# Patient Record
Sex: Male | Born: 1995 | Race: White | Hispanic: No | Marital: Single | Smoking: Never smoker
Health system: Southern US, Community
[De-identification: ages and names within clinical notes are randomized; demographics above are authoritative.]

---
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
## 2017-02-18 ENCOUNTER — Emergency Department: Payer: No Typology Code available for payment source

## 2017-02-18 ENCOUNTER — Emergency Department
Admission: EM | Admit: 2017-02-18 | Discharge: 2017-02-18 | Disposition: A | Discharge status: Home / Self Care | Payer: No Typology Code available for payment source | Attending: Emergency Medicine | Admitting: Emergency Medicine

## 2017-02-18 DIAGNOSIS — M549 Dorsalgia, unspecified: Secondary | ICD-10-CM

## 2017-02-18 DIAGNOSIS — Y999 Unspecified external cause status: Secondary | ICD-10-CM | POA: Diagnosis not present

## 2017-02-18 DIAGNOSIS — S161XXA Strain of muscle, fascia and tendon at neck level, initial encounter: Secondary | ICD-10-CM | POA: Diagnosis not present

## 2017-02-18 DIAGNOSIS — Y9389 Activity, other specified: Secondary | ICD-10-CM | POA: Diagnosis not present

## 2017-02-18 DIAGNOSIS — M546 Pain in thoracic spine: Secondary | ICD-10-CM | POA: Diagnosis not present

## 2017-02-18 DIAGNOSIS — S0990XA Unspecified injury of head, initial encounter: Secondary | ICD-10-CM | POA: Diagnosis present

## 2017-02-18 DIAGNOSIS — Y92410 Unspecified street and highway as the place of occurrence of the external cause: Secondary | ICD-10-CM | POA: Insufficient documentation

## 2017-02-18 MED ORDER — CYCLOBENZAPRINE HCL 10 MG PO TABS
10.0000 mg | ORAL_TABLET | Freq: Once | ORAL | Status: AC
  Administered 2017-02-18: 10 mg via ORAL
  Filled 2017-02-18: qty 1

## 2017-02-18 MED ORDER — CYCLOBENZAPRINE HCL 10 MG PO TABS: 10.0000 mg | ORAL_TABLET | Freq: Three times a day (TID) | ORAL | 0 refills | Status: AC | PRN

## 2017-02-18 MED ORDER — NAPROXEN 500 MG PO TABS: 500.0000 mg | ORAL_TABLET | Freq: Two times a day (BID) | ORAL | 0 refills | Status: AC

## 2017-02-18 MED ORDER — NAPROXEN 500 MG PO TABS
500.0000 mg | ORAL_TABLET | Freq: Once | ORAL | Status: AC
  Administered 2017-02-18: 500 mg via ORAL
  Filled 2017-02-18: qty 1
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
## 2017-02-18 NOTE — Discharge Instructions (Signed)
CT scans and x-rays all negative today. Take the medications as prescribed. See your primary care provider for symptoms that are not improving over the week. Return to the ER for symptoms that change or worsen if you are unable to schedule an appointment.
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
## 2017-06-28 ENCOUNTER — Emergency Department
Admission: EM | Admit: 2017-06-28 | Discharge: 2017-06-28 | Disposition: A | Discharge status: Home / Self Care | Payer: Medicaid Other | Attending: Emergency Medicine | Admitting: Emergency Medicine

## 2017-06-28 ENCOUNTER — Encounter: Payer: Self-pay | Admitting: Emergency Medicine

## 2017-06-28 ENCOUNTER — Emergency Department: Payer: Medicaid Other

## 2017-06-28 DIAGNOSIS — Z79899 Other long term (current) drug therapy: Secondary | ICD-10-CM | POA: Diagnosis not present

## 2017-06-28 DIAGNOSIS — R42 Dizziness and giddiness: Secondary | ICD-10-CM | POA: Diagnosis present

## 2017-06-28 LAB — URINE DRUG SCREEN, QUALITATIVE (ARMC ONLY)
AMPHETAMINES, UR SCREEN: NOT DETECTED
BARBITURATES, UR SCREEN: NOT DETECTED
Benzodiazepine, Ur Scrn: NOT DETECTED
COCAINE METABOLITE, UR ~~LOC~~: NOT DETECTED
Cannabinoid 50 Ng, Ur ~~LOC~~: NOT DETECTED
MDMA (ECSTASY) UR SCREEN: NOT DETECTED
METHADONE SCREEN, URINE: NOT DETECTED
Opiate, Ur Screen: NOT DETECTED
Phencyclidine (PCP) Ur S: NOT DETECTED
TRICYCLIC, UR SCREEN: NOT DETECTED

## 2017-06-28 LAB — URINALYSIS, COMPLETE (UACMP) WITH MICROSCOPIC
Bacteria, UA: NONE SEEN
Bilirubin Urine: NEGATIVE
GLUCOSE, UA: NEGATIVE mg/dL
HGB URINE DIPSTICK: NEGATIVE
Ketones, ur: NEGATIVE mg/dL
Leukocytes, UA: NEGATIVE
NITRITE: NEGATIVE
Protein, ur: NEGATIVE mg/dL
SPECIFIC GRAVITY, URINE: 1.002 — AB (ref 1.005–1.030)
Squamous Epithelial / LPF: NONE SEEN
pH: 7 (ref 5.0–8.0)

## 2017-06-28 LAB — BASIC METABOLIC PANEL
Anion gap: 8 (ref 5–15)
BUN: 10 mg/dL (ref 6–20)
CALCIUM: 9.7 mg/dL (ref 8.9–10.3)
CO2: 27 mmol/L (ref 22–32)
CREATININE: 1.23 mg/dL (ref 0.61–1.24)
Chloride: 104 mmol/L (ref 101–111)
GLUCOSE: 103 mg/dL — AB (ref 65–99)
Potassium: 4.3 mmol/L (ref 3.5–5.1)
Sodium: 139 mmol/L (ref 135–145)

## 2017-06-28 LAB — CBC
HCT: 45.2 % (ref 40.0–52.0)
Hemoglobin: 15.5 g/dL (ref 13.0–18.0)
MCH: 30.8 pg (ref 26.0–34.0)
MCHC: 34.3 g/dL (ref 32.0–36.0)
MCV: 89.9 fL (ref 80.0–100.0)
PLATELETS: 181 10*3/uL (ref 150–440)
RBC: 5.02 MIL/uL (ref 4.40–5.90)
RDW: 12.5 % (ref 11.5–14.5)
WBC: 8.4 10*3/uL (ref 3.8–10.6)

## 2017-06-28 LAB — TSH: TSH: 1.773 u[IU]/mL (ref 0.350–4.500)

## 2017-06-28 LAB — T4, FREE: Free T4: 0.9 ng/dL (ref 0.61–1.12)

## 2017-06-28 LAB — TROPONIN I: Troponin I: <0.03 ng/mL (ref <0.03)

## 2017-06-28 MED ORDER — SODIUM CHLORIDE 0.9 % IV BOLUS (SEPSIS)
1000.0000 mL | Freq: Once | INTRAVENOUS | Status: AC
  Administered 2017-06-28: 1000 mL via INTRAVENOUS

## 2017-06-28 NOTE — ED Triage Notes (Signed)
Pt states he started having weakness last night that lasted several hours and vomited once.  Pts mom stated he should go get checked out today if he is not feeling better.  He states he felt dizzy, unaware, photosensitivity, and at times off balance.  Pt is AOx4 during triage.

## 2017-06-28 NOTE — ED Provider Notes (Signed)
[facility] Emergency Department Provider Note  ____________________________________________  Time seen: Approximately 9:15 PM  I have reviewed the triage vital signs and the nursing notes.   HISTORY  Chief Complaint Dizziness   HPI Ronald Peyeraul T Iribe Jr. is a 21 y.o. male with no significant past medical history who presents for evaluation of lightheadedness. Patient reports that after leaving work yesterday he started feeling lightheaded. 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 Had 2 episodes of nonbloody nonbilious emesis and went home. He reports that he didn't drink much fluid throughout the day yesterday and was really hot. This morning he woke up feeling better however continued to have intermittent episodes of lightheadedness. Has been nauseated and not had a normal appetite today. Not really eating or drinking much today. No diarrhea, no cough or congestion, no fever or chills, no chest pain or shortness of breath, no abdominal pain, no dysuria or hematuria, no palpitations, no syncope. No tick bites. No headache. No neck stiffness. Denies alcohol, drugs or cigarettes.  History reviewed. No pertinent past medical history.  There are no active problems to display for this patient.   History reviewed. No pertinent surgical history.  Prior to Admission medications   Medication Sig Start Date End Date Taking? Authorizing Provider  cyclobenzaprine (FLEXERIL) 10 MG tablet Take 1 tablet (10 mg total) by mouth 3 (three) times daily as needed for muscle spasms. 02/18/17   Triplett, Rulon Eisenmengerari B, FNP  naproxen (NAPROSYN) 500 MG tablet Take 1 tablet (500 mg total) by mouth 2 (two) times daily with a meal. 02/18/17   Triplett, Cari B, FNP    Allergies Penicillins  No family history on file.  Social History Social History  Substance Use Topics  . Smoking status: Never Smoker  . Smokeless tobacco: Never Used  . Alcohol use No    Review of Systems  Constitutional: Negative  for fever. + Lightheadedness Eyes: Negative for visual changes. ENT: Negative for sore throat. Neck: No neck pain  Cardiovascular: Negative for chest pain. Respiratory: Negative for shortness of breath. Gastrointestinal: Negative for abdominal pain,  Diarrhea. + N/V Genitourinary: Negative for dysuria. Musculoskeletal: Negative for back pain. Skin: Negative for rash. Neurological: Negative for headaches, weakness or numbness. Psych: No SI or HI  ____________________________________________   PHYSICAL EXAM:  VITAL SIGNS: ED Triage Vitals [06/28/17 2013]  Enc Vitals Group     BP (!) 135/53     Pulse Rate (!) 54     Resp 16     Temp 98.6 F (37 C)     Temp Source Oral     SpO2 100 %     Weight 136 lb (61.7 kg)     Height 5\' 9"  (1.753 m)     Head Circumference      Peak Flow      Pain Score 0     Pain Loc      Pain Edu?      Excl. in GC?     Constitutional: Alert and oriented. Well appearing and in no apparent distress. HEENT:      Head: Normocephalic and atraumatic.         Eyes: Conjunctivae are normal. Sclera is non-icteric.       Mouth/Throat: Mucous membranes are moist.       Neck: Supple with no signs of meningismus. Cardiovascular: Bradycardic with regular rhythm. No murmurs, gallops, or rubs. 2+ symmetrical distal pulses are present in all extremities. No JVD. Respiratory: Normal respiratory effort. Lungs  are clear to auscultation bilaterally. No wheezes, crackles, or rhonchi.  Gastrointestinal: Soft, non tender, and non distended with positive bowel sounds. No rebound or guarding. Genitourinary: No CVA tenderness. Musculoskeletal: Nontender with normal range of motion in all extremities. No edema, cyanosis, or erythema of extremities. Neurologic: Normal speech and language. Face is symmetric. Moving all extremities. No gross focal neurologic deficits are appreciated. Skin: Skin is warm, dry and intact. No rash noted. Psychiatric: Mood and affect are normal.  Speech and behavior are normal.  ____________________________________________   LABS (all labs ordered are listed, but only abnormal results are displayed)  Labs Reviewed  BASIC METABOLIC PANEL - Abnormal; Notable for the following:       Result Value   Glucose, Bld 103 (*)    All other components within normal limits  URINALYSIS, COMPLETE (UACMP) WITH MICROSCOPIC - Abnormal; Notable for the following:    Color, Urine STRAW (*)    APPearance CLEAR (*)    Specific Gravity, Urine 1.002 (*)    All other components within normal limits  CBC  TSH  TROPONIN I  T4, FREE  URINE DRUG SCREEN, QUALITATIVE ([facility] ONLY)  CBG MONITORING, ED   ____________________________________________  EKG  ED ECG REPORT I, Nita Sickle, the attending physician, personally viewed and interpreted this ECG.  Sinus bradycardia, rate of 53, right bundle branch block, normal QTC, normal axis, no ST elevations or depressions. No prior for comparison.  ____________________________________________  RADIOLOGY  CXR: Negative ____________________________________________   PROCEDURES  Procedure(s) performed: None Procedures Critical Care performed:  None ____________________________________________   INITIAL IMPRESSION / ASSESSMENT AND PLAN / ED COURSE  21 y.o. male with no significant past medical history who presents for evaluation of lightheadedness since yesterday in the setting of decreased Po intake, really hot weather, and two episodes of NBNB emesis. Patient is well-appearing, in no distress, vital signs show bradycardia. EKG showing sinus bradycardia with no evidence of block or ischemia. PE no acute findings, abdomen is soft with no tenderness throughout. Review of Epic shows the patient has had HR of 56 in prior visit in 01/2017. No prior EKG for comparison however patient has no CP/ SOB/ fever therefore  Low suspicion for cardiac etiology such as myocarditis, ischemia, arrhythmia. Will  monitor on telemetry. Will check basic labs, orthostatic VS, urine. Will check thyroid. Will give IVF   ED COURSE:  Patient felt back to normal after 1 L of IV fluid. A chest x-ray and blood work with no acute findings. Presentation concerning for slight dehydration. Patient was discharged home with close follow-up with primary care doctor.  Pertinent labs & imaging results that were available during my care of the patient were reviewed by me and considered in my medical decision making (see chart for details).    ____________________________________________   FINAL CLINICAL IMPRESSION(S) / ED DIAGNOSES  Final diagnoses:  Lightheadedness      NEW MEDICATIONS STARTED DURING THIS VISIT:  Discharge Medication List as of 06/28/2017 10:16 PM       Note:  This document was prepared using Dragon voice recognition software and may include unintentional dictation errors.    Don Perking, [place], MD 07/01/17 [phone]
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
## 2018-12-28 IMAGING — CR DG CHEST 2V
1 series · 2 of 2 positions shown · non-contrast
Comparison: None.

CLINICAL DATA: Lightheadedness.  Weakness.

EXAM:
CHEST  2 VIEW

[Series 1: dg chest 2 view · 0.14mm/px · 2 of 2 slices shown]
[im 1/2]
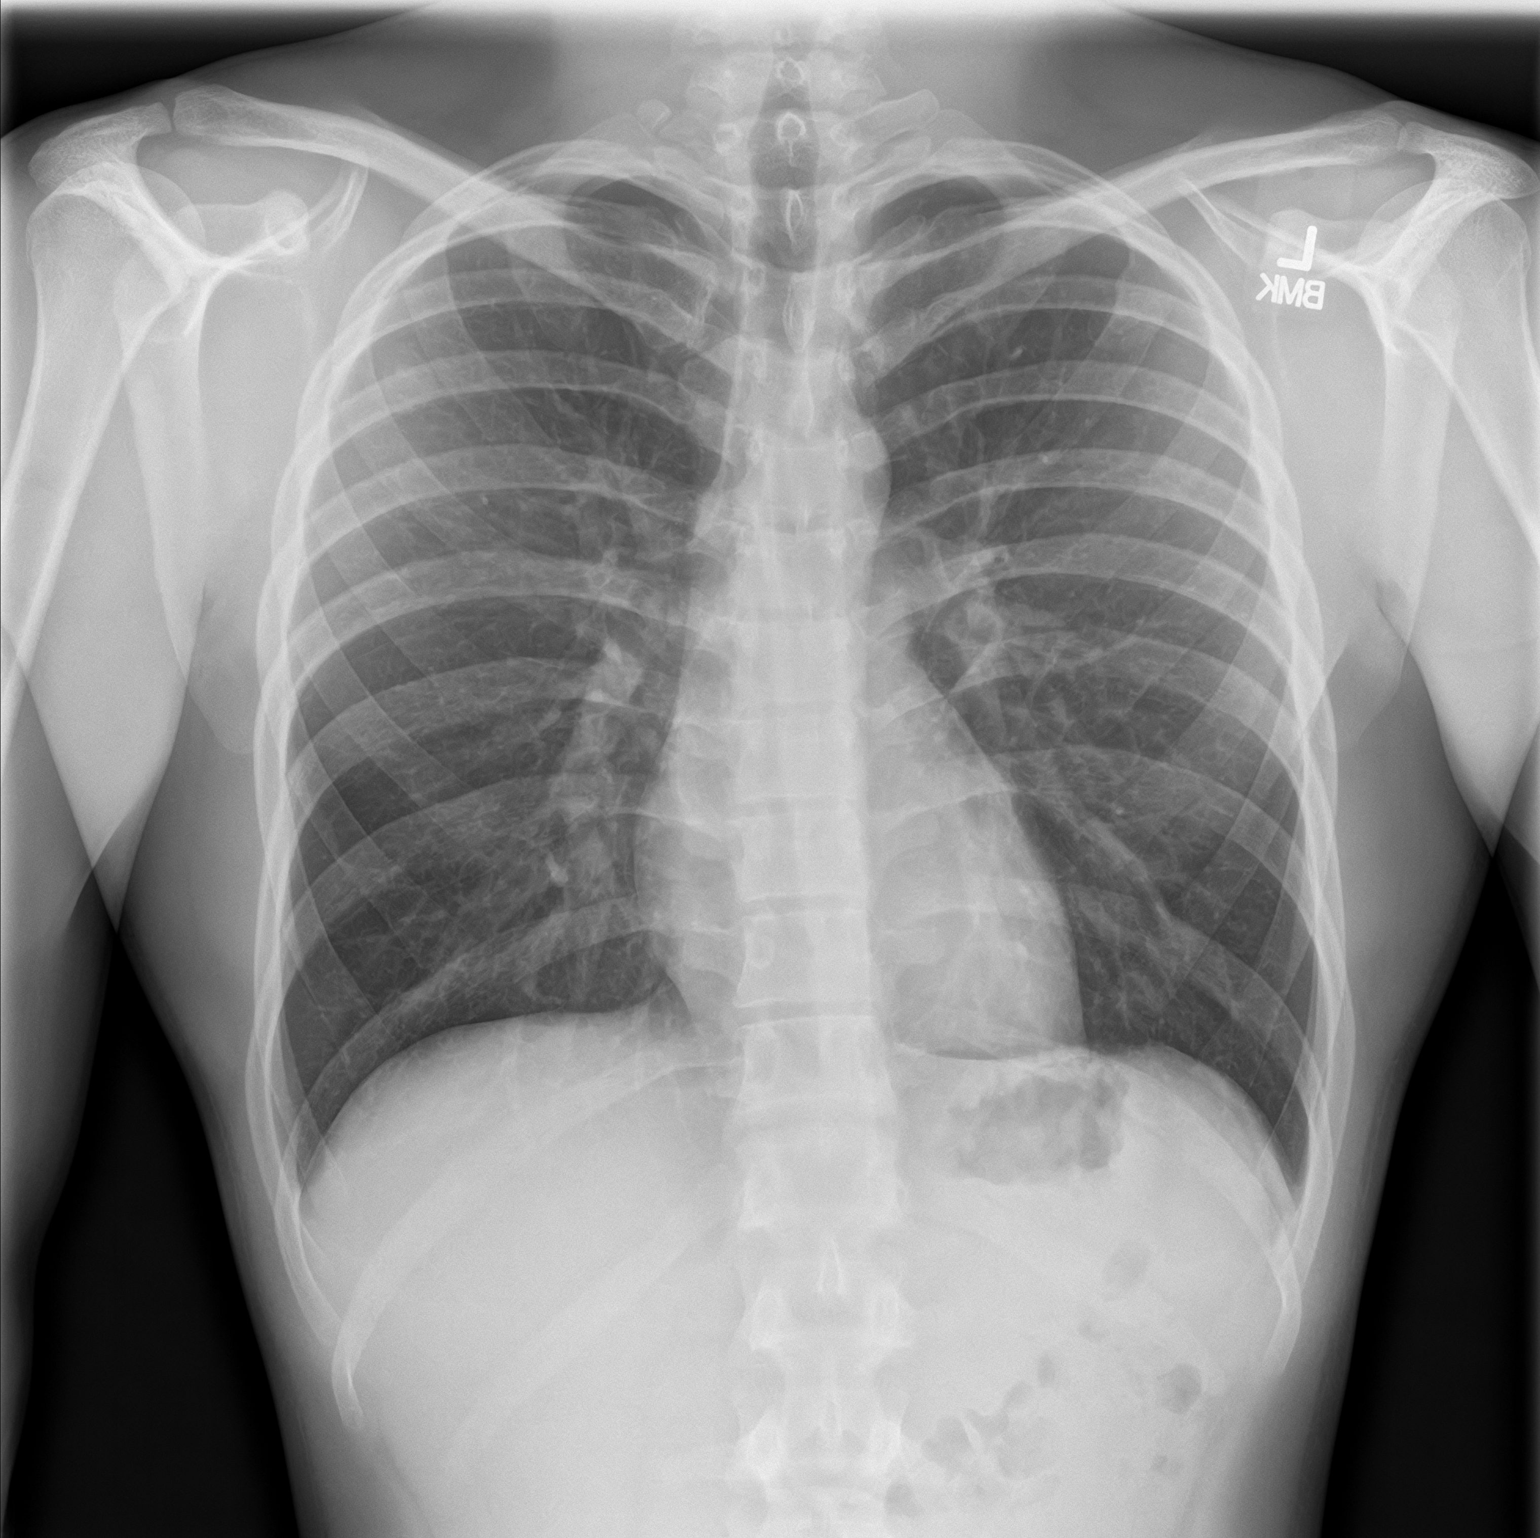
[im 2/2]
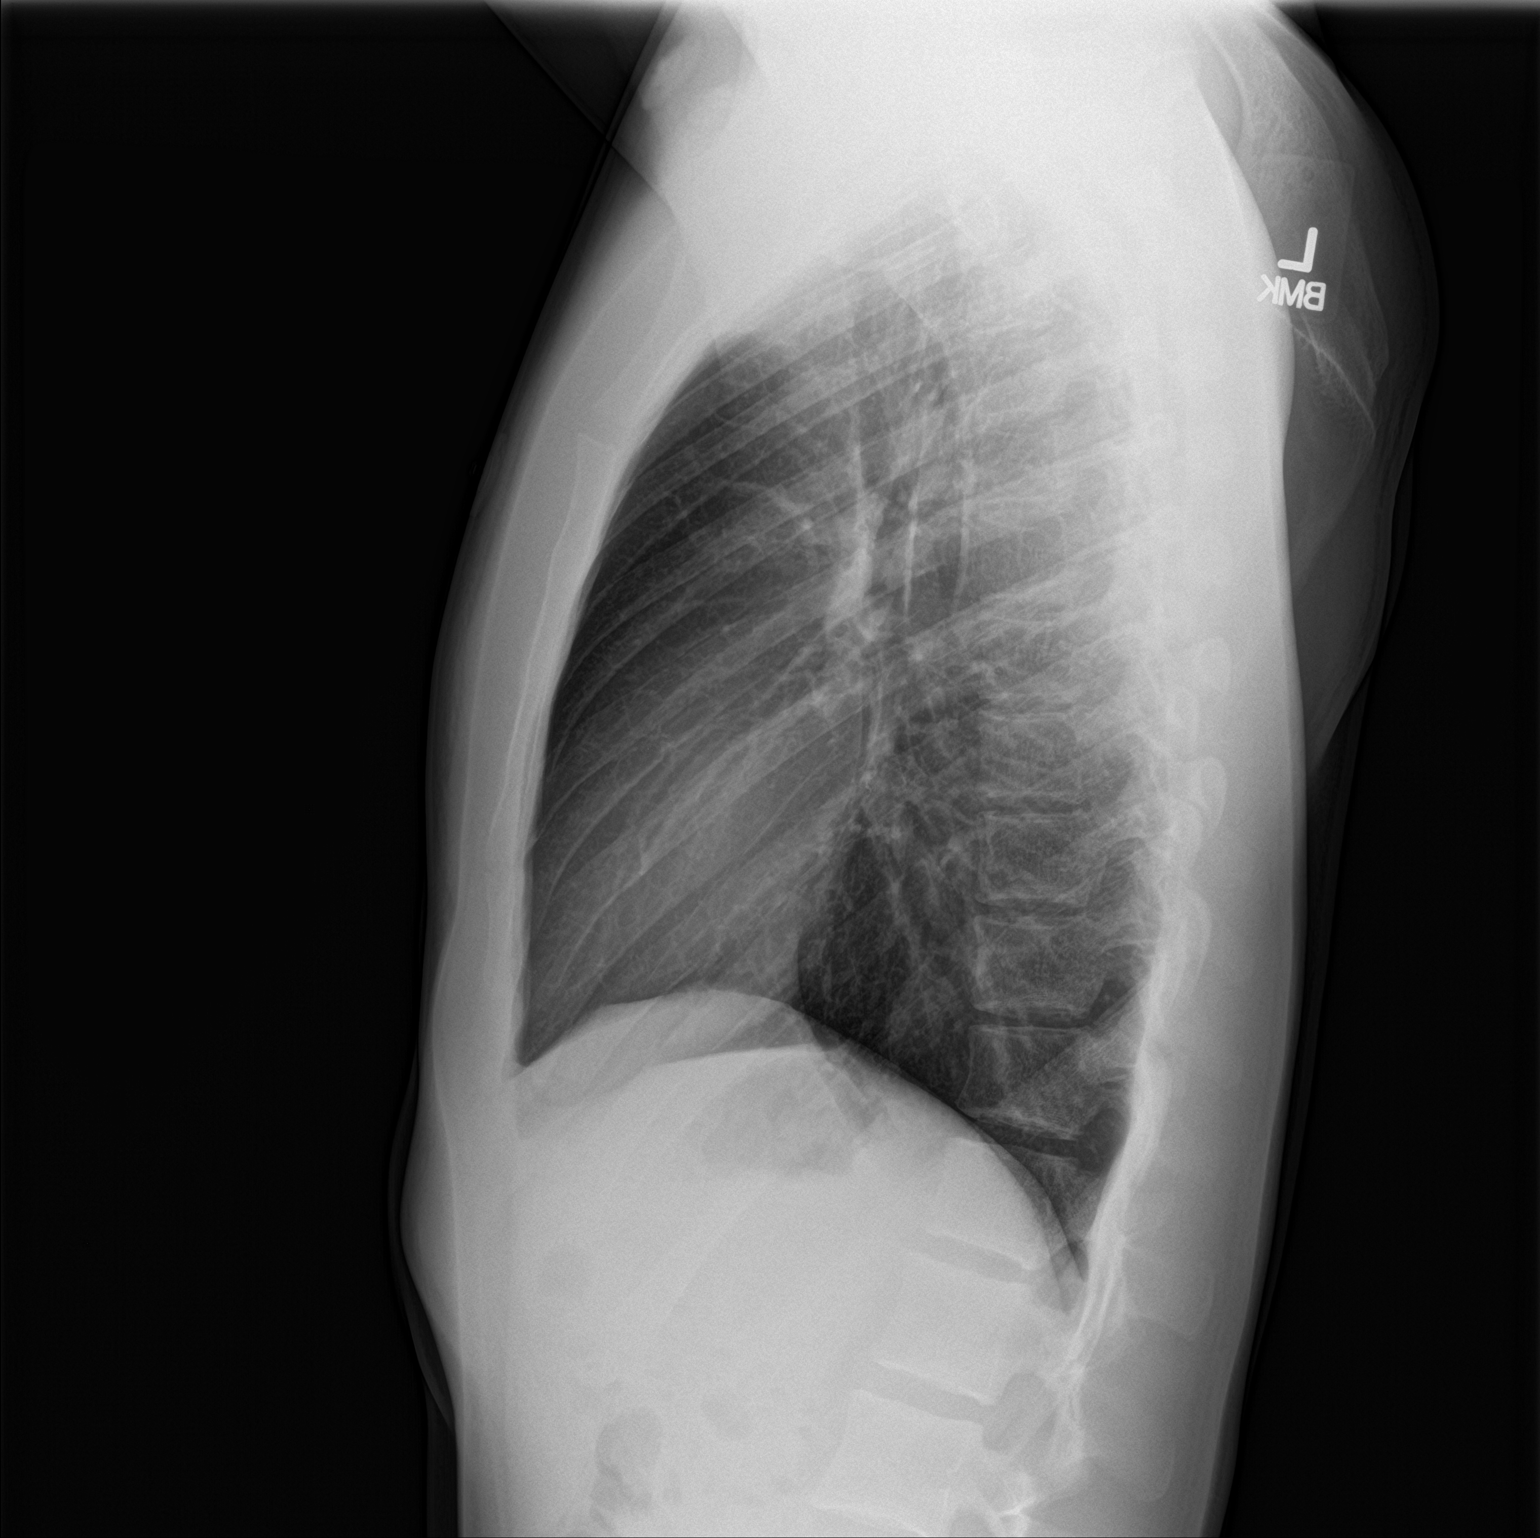

[2 of 2 positions shown; findings below may reference images not displayed]

FINDINGS: The cardiomediastinal contours are normal. The lungs are clear.
Pulmonary vasculature is normal. No consolidation, pleural effusion,
or pneumothorax. No acute osseous abnormalities are seen.
IMPRESSION: Unremarkable radiographs of the chest.

## 2019-12-15 ENCOUNTER — Ambulatory Visit: Payer: Self-pay | Admitting: Physician Assistant

## 2019-12-15 ENCOUNTER — Other Ambulatory Visit: Payer: Self-pay

## 2019-12-15 DIAGNOSIS — Z113 Encounter for screening for infections with a predominantly sexual mode of transmission: Secondary | ICD-10-CM

## 2019-12-15 DIAGNOSIS — Z202 Contact with and (suspected) exposure to infections with a predominantly sexual mode of transmission: Secondary | ICD-10-CM

## 2019-12-15 MED ORDER — AZITHROMYCIN 500 MG PO TABS
1000.0000 mg | ORAL_TABLET | Freq: Once | ORAL | Status: AC
  Administered 2019-12-15: 1000 mg via ORAL

## 2019-12-16 ENCOUNTER — Encounter: Payer: Self-pay | Admitting: Physician Assistant

## 2019-12-16 NOTE — Progress Notes (Signed)
   [facility] Department STI clinic/screening visit  Subjective:  Ronald Pineda. is a 23 y.o. male being seen today for an STI screening visit. The patient reports they do not have symptoms.    Patient has the following medical conditions:  There are no problems to display for this patient.    Chief Complaint  Patient presents with  . SEXUALLY TRANSMITTED DISEASE    HPI  Patient reports that he is not having any symptoms but is a contact to Chlamydia.  Declines screening exam, testing, and blood work today.   See flowsheet for further details and programmatic requirements.    The following portions of the patient's history were reviewed and updated as appropriate: allergies, current medications, past medical history, past social history, past surgical history and problem list.  Objective:  There were no vitals filed for this visit.  Physical Exam Constitutional:      General: He is not in acute distress.    Appearance: Normal appearance. He is normal weight.  HENT:     Head: Normocephalic and atraumatic.  Eyes:     Conjunctiva/sclera: Conjunctivae normal.  Pulmonary:     Effort: Pulmonary effort is normal.  Neurological:     Mental Status: He is alert and oriented to person, place, and time.  Psychiatric:        Mood and Affect: Mood normal.        Behavior: Behavior normal.        Thought Content: Thought content normal.        Judgment: Judgment normal.       Assessment and Plan:  Ronald Pineda. is a 23 y.o. male presenting to the [facility] Department for STI screening  1. Screening for STD (sexually transmitted disease) Patient into clinic without symptoms today. Declines testing and blood work.  Requests treatment as a contact only. Rec condoms with all sex.  2. Chlamydia contact Will treat as a contact to Chlamydia with Azithromycin 1 g po DOT today. No sex for 7 days and until after partner completes treatment. RTC  if vomits < 2 hr after taking medicine for re-treatment. - azithromycin (ZITHROMAX) tablet 1,000 mg     No follow-ups on file.  No future appointments.  Jerene Dilling, PA

## 2020-02-17 ENCOUNTER — Ambulatory Visit: Payer: Self-pay
